# Patient Record
Sex: Female | Born: 1945 | Race: White | Hispanic: No | Marital: Married | Smoking: Never smoker
Health system: Southern US, Community
[De-identification: ages and names within clinical notes are randomized; demographics above are authoritative.]

## PROBLEM LIST (undated history)

## (undated) DIAGNOSIS — E119 Type 2 diabetes mellitus without complications: Secondary | ICD-10-CM

## (undated) DIAGNOSIS — I10 Essential (primary) hypertension: Secondary | ICD-10-CM

---

## 2016-07-17 ENCOUNTER — Observation Stay (HOSPITAL_COMMUNITY)
Admission: EM | Admit: 2016-07-17 | Discharge: 2016-07-18 | Disposition: A | Discharge status: Home / Self Care | Payer: Medicare Other | Attending: Internal Medicine | Admitting: Internal Medicine

## 2016-07-17 ENCOUNTER — Emergency Department (HOSPITAL_COMMUNITY): Payer: Medicare Other

## 2016-07-17 ENCOUNTER — Encounter (HOSPITAL_COMMUNITY): Payer: Self-pay

## 2016-07-17 DIAGNOSIS — M545 Low back pain: Secondary | ICD-10-CM | POA: Diagnosis not present

## 2016-07-17 DIAGNOSIS — G9340 Encephalopathy, unspecified: Secondary | ICD-10-CM | POA: Diagnosis not present

## 2016-07-17 DIAGNOSIS — Z794 Long term (current) use of insulin: Secondary | ICD-10-CM | POA: Diagnosis not present

## 2016-07-17 DIAGNOSIS — F0390 Unspecified dementia without behavioral disturbance: Secondary | ICD-10-CM | POA: Insufficient documentation

## 2016-07-17 DIAGNOSIS — R569 Unspecified convulsions: Secondary | ICD-10-CM | POA: Insufficient documentation

## 2016-07-17 DIAGNOSIS — G8929 Other chronic pain: Secondary | ICD-10-CM | POA: Insufficient documentation

## 2016-07-17 DIAGNOSIS — R110 Nausea: Secondary | ICD-10-CM

## 2016-07-17 DIAGNOSIS — R4182 Altered mental status, unspecified: Secondary | ICD-10-CM

## 2016-07-17 DIAGNOSIS — I10 Essential (primary) hypertension: Secondary | ICD-10-CM | POA: Diagnosis not present

## 2016-07-17 DIAGNOSIS — G454 Transient global amnesia: Secondary | ICD-10-CM | POA: Diagnosis not present

## 2016-07-17 DIAGNOSIS — R413 Other amnesia: Secondary | ICD-10-CM

## 2016-07-17 DIAGNOSIS — Z79899 Other long term (current) drug therapy: Secondary | ICD-10-CM | POA: Diagnosis not present

## 2016-07-17 DIAGNOSIS — E119 Type 2 diabetes mellitus without complications: Secondary | ICD-10-CM

## 2016-07-17 HISTORY — DX: Essential (primary) hypertension: I10

## 2016-07-17 HISTORY — DX: Type 2 diabetes mellitus without complications: E11.9

## 2016-07-17 LAB — URINALYSIS, ROUTINE W REFLEX MICROSCOPIC
Bilirubin Urine: NEGATIVE
Glucose, UA: >=500 mg/dL — AB
HGB URINE DIPSTICK: NEGATIVE
Ketones, ur: NEGATIVE mg/dL
NITRITE: NEGATIVE
Protein, ur: NEGATIVE mg/dL
Specific Gravity, Urine: 1.012 (ref 1.005–1.030)
pH: 6 (ref 5.0–8.0)

## 2016-07-17 LAB — SALICYLATE LEVEL: Salicylate Lvl: <7 mg/dL (ref 2.8–30.0)

## 2016-07-17 LAB — CBC WITH DIFFERENTIAL/PLATELET
BASOS ABS: 0 10*3/uL (ref 0.0–0.1)
BASOS PCT: 1 %
EOS ABS: 0.1 10*3/uL (ref 0.0–0.7)
EOS PCT: 2 %
HCT: 41.6 % (ref 36.0–46.0)
Hemoglobin: 14.9 g/dL (ref 12.0–15.0)
Lymphocytes Relative: 31 %
Lymphs Abs: 1.7 10*3/uL (ref 0.7–4.0)
MCH: 30 pg (ref 26.0–34.0)
MCHC: 35.8 g/dL (ref 30.0–36.0)
MCV: 83.7 fL (ref 78.0–100.0)
Monocytes Absolute: 0.4 10*3/uL (ref 0.1–1.0)
Monocytes Relative: 8 %
Neutro Abs: 3.3 10*3/uL (ref 1.7–7.7)
Neutrophils Relative %: 58 %
PLATELETS: 175 10*3/uL (ref 150–400)
RBC: 4.97 MIL/uL (ref 3.87–5.11)
RDW: 13.3 % (ref 11.5–15.5)
WBC: 5.6 10*3/uL (ref 4.0–10.5)

## 2016-07-17 LAB — HEPATIC FUNCTION PANEL
ALBUMIN: 3.7 g/dL (ref 3.5–5.0)
ALK PHOS: 62 U/L (ref 38–126)
ALT: 14 U/L (ref 14–54)
AST: 18 U/L (ref 15–41)
Bilirubin, Direct: <0.1 mg/dL — ABNORMAL LOW (ref 0.1–0.5)
TOTAL PROTEIN: 7 g/dL (ref 6.5–8.1)
Total Bilirubin: 0.2 mg/dL — ABNORMAL LOW (ref 0.3–1.2)

## 2016-07-17 LAB — BASIC METABOLIC PANEL
ANION GAP: 7 (ref 5–15)
BUN: 8 mg/dL (ref 6–20)
CO2: 26 mmol/L (ref 22–32)
Calcium: 9.6 mg/dL (ref 8.9–10.3)
Chloride: 106 mmol/L (ref 101–111)
Creatinine, Ser: 0.56 mg/dL (ref 0.44–1.00)
Glucose, Bld: 278 mg/dL — ABNORMAL HIGH (ref 65–99)
POTASSIUM: 4 mmol/L (ref 3.5–5.1)
SODIUM: 139 mmol/L (ref 135–145)

## 2016-07-17 LAB — I-STAT TROPONIN, ED: TROPONIN I, POC: 0 ng/mL (ref 0.00–0.08)

## 2016-07-17 LAB — GLUCOSE, CAPILLARY: Glucose-Capillary: 263 mg/dL — ABNORMAL HIGH (ref 65–99)

## 2016-07-17 MED ORDER — PANTOPRAZOLE SODIUM 40 MG PO TBEC
40.0000 mg | DELAYED_RELEASE_TABLET | Freq: Once | ORAL | Status: AC
  Administered 2016-07-17: 40 mg via ORAL
  Filled 2016-07-17: qty 1

## 2016-07-17 MED ORDER — ONDANSETRON HCL 4 MG PO TABS: 4.0000 mg | ORAL_TABLET | Freq: Four times a day (QID) | ORAL | Status: DC | PRN

## 2016-07-17 MED ORDER — SODIUM CHLORIDE 0.9 % IV SOLN
INTRAVENOUS | Status: DC
  Administered 2016-07-17 – 2016-07-18 (×2): via INTRAVENOUS

## 2016-07-17 MED ORDER — METOCLOPRAMIDE HCL 5 MG/ML IJ SOLN
10.0000 mg | Freq: Once | INTRAMUSCULAR | Status: AC
  Administered 2016-07-17: 10 mg via INTRAVENOUS
  Filled 2016-07-17: qty 2

## 2016-07-17 MED ORDER — SODIUM CHLORIDE 0.9% FLUSH
3.0000 mL | Freq: Two times a day (BID) | INTRAVENOUS | Status: DC
  Administered 2016-07-18: 3 mL via INTRAVENOUS

## 2016-07-17 MED ORDER — ONDANSETRON HCL 4 MG/2ML IJ SOLN
4.0000 mg | Freq: Once | INTRAMUSCULAR | Status: AC
  Administered 2016-07-17: 4 mg via INTRAVENOUS
  Filled 2016-07-17: qty 2

## 2016-07-17 MED ORDER — INSULIN ASPART 100 UNIT/ML ~~LOC~~ SOLN
0.0000 [IU] | Freq: Three times a day (TID) | SUBCUTANEOUS | Status: DC
  Administered 2016-07-18 (×2): 3 [IU] via SUBCUTANEOUS

## 2016-07-17 MED ORDER — INSULIN ASPART 100 UNIT/ML ~~LOC~~ SOLN
0.0000 [IU] | Freq: Every day | SUBCUTANEOUS | Status: DC
  Administered 2016-07-17: 3 [IU] via SUBCUTANEOUS

## 2016-07-17 MED ORDER — ACETAMINOPHEN 650 MG RE SUPP: 650.0000 mg | Freq: Four times a day (QID) | RECTAL | Status: DC | PRN

## 2016-07-17 MED ORDER — ONDANSETRON HCL 4 MG/2ML IJ SOLN
4.0000 mg | Freq: Four times a day (QID) | INTRAMUSCULAR | Status: DC | PRN
  Administered 2016-07-18: 4 mg via INTRAVENOUS
  Filled 2016-07-17: qty 2

## 2016-07-17 MED ORDER — SODIUM CHLORIDE 0.9 % IV SOLN
Freq: Once | INTRAVENOUS | Status: AC
  Administered 2016-07-17: 21:00:00 via INTRAVENOUS

## 2016-07-17 MED ORDER — HYDRALAZINE HCL 20 MG/ML IJ SOLN: 20.0000 mg | Freq: Four times a day (QID) | INTRAMUSCULAR | Status: DC | PRN

## 2016-07-17 MED ORDER — ACETAMINOPHEN 325 MG PO TABS
650.0000 mg | ORAL_TABLET | Freq: Four times a day (QID) | ORAL | Status: DC | PRN
  Administered 2016-07-18: 650 mg via ORAL
  Filled 2016-07-17: qty 2

## 2016-07-17 MED ORDER — MORPHINE SULFATE (PF) 4 MG/ML IV SOLN
2.0000 mg | Freq: Once | INTRAVENOUS | Status: AC
  Administered 2016-07-17: 2 mg via INTRAVENOUS
  Filled 2016-07-17: qty 1

## 2016-07-17 NOTE — ED Provider Notes (Signed)
[facility]-EMERGENCY DEPT Provider Note   CSN: [id number] Arrival date & time: 07/17/16  1320     History   Chief Complaint Chief Complaint  Patient presents with  . Altered Mental Status    HPI Denise Green is a 70 y.o. female. She presents with EMS, accompanied by family with memory problems  Family states that they have noticed over the last year some occasional difficulty with memory. This has been rather subtle and occasional.  They've been planning a trip from [place] to [place] to see her nieces and family. They wrapped presents yesterday including last night she was fine.  This morning she spoke with her daughter to discuss the time that they would leave to take these presents as above. In the car between [place] the patient started asking why they were driving and where they were going and what they were doing.  Patient could not recall the name of her family was in the car with her or the family that she was going to see.  On arrival she has no complaints. Initially complains of mild headache here and family states she complained of a mild headache last night.   No history of vascular disease, heart disease, strokes/TIAs. No history of hypertension. Is diabetic and is on Lantus at night.  HPI  Past Medical History:  Diagnosis Date  . Diabetes mellitus without complication (HCC)   . Hypertension     There are no active problems to display for this patient.   History reviewed. No pertinent surgical history.  OB History    No data available       Home Medications    Prior to Admission medications   Not on File    Family History History reviewed. No pertinent family history.  Social History Social History  Substance Use Topics  . Smoking status: Never Smoker  . Smokeless tobacco: Never Used  . Alcohol use No     Allergies   Patient has no known allergies.   Review of Systems Review of Systems  Constitutional:  Negative for appetite change, chills, diaphoresis, fatigue and fever.  HENT: Negative for mouth sores, sore throat and trouble swallowing.   Eyes: Negative for visual disturbance.  Respiratory: Negative for cough, chest tightness, shortness of breath and wheezing.   Cardiovascular: Negative for chest pain.  Gastrointestinal: Negative for abdominal distention, abdominal pain, diarrhea, nausea and vomiting.  Endocrine: Negative for polydipsia, polyphagia and polyuria.  Genitourinary: Negative for dysuria, frequency and hematuria.  Musculoskeletal: Negative for gait problem.  Skin: Negative for color change, pallor and rash.  Neurological: Positive for headaches. Negative for dizziness, syncope and light-headedness.       Memory difficulty  Hematological: Does not bruise/bleed easily.  Psychiatric/Behavioral: Negative for behavioral problems and confusion.     Physical Exam Updated Vital Signs BP 179/79   Pulse 84   Resp 19   SpO2 98%   Physical Exam  Constitutional: She is oriented to person, place, and time. She appears well-developed and well-nourished. No distress.  HENT:  Head: Normocephalic.  Eyes: Conjunctivae are normal. Pupils are equal, round, and reactive to light. No scleral icterus.  Neck: Normal range of motion. Neck supple. No thyromegaly present.  Cardiovascular: Normal rate and regular rhythm.  Exam reveals no gallop and no friction rub.   No murmur heard. Pulmonary/Chest: Effort normal and breath sounds normal. No respiratory distress. She has no wheezes. She has no rales.  Abdominal: Soft. Bowel sounds are normal. She exhibits  no distension. There is no tenderness. There is no rebound.  Musculoskeletal: Normal range of motion.  Neurological: She is alert and oriented to person, place, and time.  No cranial nerve deficits. She has some difficulty recognizing some colors in her left lateral visual field with both eyes but does correct this.  She is unable to recall  the name of her husband and both daughters, (who are present). Cannot recall the name of her grandchildren. She is able to tell me that she is at the hospital, as well as the day, and date.  Skin: Skin is warm and dry. No rash noted.  Psychiatric: She has a normal mood and affect. Her behavior is normal.     ED Treatments / Results  Labs (all labs ordered are listed, but only abnormal results are displayed) Labs Reviewed  URINALYSIS, ROUTINE W REFLEX MICROSCOPIC - Abnormal; Notable for the following:       Result Value   Glucose, UA >=500 (*)    Leukocytes, UA TRACE (*)    Bacteria, UA FEW (*)    Squamous Epithelial / LPF 0-5 (*)    All other components within normal limits  CBC WITH DIFFERENTIAL/PLATELET  BASIC METABOLIC PANEL  I-STAT TROPOININ, ED    EKG  EKG Interpretation None       Radiology Ct Head Wo Contrast  Result Date: 07/17/2016 CLINICAL DATA:  Global amnesia, headache, no injury EXAM: CT HEAD WITHOUT CONTRAST TECHNIQUE: Contiguous axial images were obtained from the base of the skull through the vertex without intravenous contrast. COMPARISON:  None. FINDINGS: Brain: The ventricular system is within normal limits in size for age and there is mild cortical atrophy present. The septum is midline in position. Benign-appearing bilateral basal ganglial calcifications are noted. No hemorrhage, mass lesion, or acute infarction is seen. Vascular: No vascular abnormality is noted on this unenhanced study. Skull: No acute calvarial abnormality is seen. Sinuses/Orbits: There is mucosal thickening within the sphenoid sinus consistent with sphenoid sinus disease. The remainder of the paranasal sinuses are pneumatized with a probable small retention cyst in the floor of the right maxillary sinus. Other: None IMPRESSION: 1. Mild atrophy.  No acute intracranial abnormality. 2. Sphenoid sinus disease. Electronically Signed   By: Dwyane DeePaul  Barry M.D.   On: 07/17/2016 14:53     Procedures Procedures (including critical care time)  Medications Ordered in ED Medications  morphine 4 MG/ML injection 2 mg (2 mg Intravenous Given 07/17/16 1526)  ondansetron (ZOFRAN) injection 4 mg (4 mg Intravenous Given 07/17/16 1526)     Initial Impression / Assessment and Plan / ED Course  I have reviewed the triage vital signs and the nursing notes.  Pertinent labs & imaging results that were available during my care of the patient were reviewed by me and considered in my medical decision making (see chart for details).  Clinical Course     Patient with apparent global amnesia. CT normal. Await MRI. Symptoms onset at 11:30 today. No indication for code stroke. Will discuss with neurology with reevaluation and MRI.  Final Clinical Impressions(s) / ED Diagnoses   Final diagnoses:  Global amnesia    New Prescriptions New Prescriptions   No medications on file     Rolland PorterMark Rafaela Dinius, MD 07/17/16 1528

## 2016-07-17 NOTE — ED Triage Notes (Signed)
Pt. Coming from home via [facility] for sudden onset confusion at 1150. EMS sts patient has no other neuro deficits. Pt. Unable to report last name or president with EMS. Pt. Aox4 at this time. Pt. Hx of DM and HTN. Pt. BP 210/130 manual en route. Pt. Denies any headache.

## 2016-07-17 NOTE — ED Provider Notes (Signed)
Assumed care from Dr. Fayrene FearingJames at 4 PM. Briefly, the patient is a 70 yo F with mild dementia here with likely TGA. No other focal neuro deficits. CT head neg. Neuro consulted, recommends normal w/u and further dispo.  No history of similar episodes. On my assessment at 4:04 PM, pt continues to have global amnesia. She knows who she is/that she is in a hospital but has persistent repetitive questioning, unsure why she is here. MRI pending.   Labs Reviewed  URINALYSIS, ROUTINE W REFLEX MICROSCOPIC - Abnormal; Notable for the following:       Result Value   Glucose, UA >=500 (*)    Leukocytes, UA TRACE (*)    Bacteria, UA FEW (*)    Squamous Epithelial / LPF 0-5 (*)    All other components within normal limits  CBC WITH DIFFERENTIAL/PLATELET  BASIC METABOLIC PANEL  I-STAT TROPOININ, ED    Course of Care: MRI neg. Pt remains awake, alert but with impaired memory. Will d/c home.  Clinical Impression: 1. Global amnesia     Disposition: Discharge  Condition: Good  I have discussed the results, Dx and Tx plan with the pt(& family if present). He/she/they expressed understanding and agree(s) with the plan. Discharge instructions discussed at great length. Strict return precautions discussed and pt &/or family have verbalized understanding of the instructions. No further questions at time of discharge.      Shaune Pollackameron Adorian Gwynne, MD 07/18/16 [phone]

## 2016-07-17 NOTE — ED Notes (Signed)
Notified Dr. Fayrene FearingJames that pt is c/o headache at this time.

## 2016-07-17 NOTE — H&P (Signed)
History and Physical    Adelei Green ZOX:[id number] DOB: 12/06/1945 DOA: 07/17/2016  PCP: Patient is from [place]  Patient coming from: HOME  Chief Complaint: Altered mental status  HPI: Denise Green is a 70 y.o. woman with a history of mild dementia at baseline, HTN, and diabetes who is accompanied by multiple family members tonight.  She is unable to give her own history due to acute mental status changes.  Per her husband and daughter, while en route to [facility] to visit family today, the patient became acutely confused.  She as unable to identify family and had forgotten where they were going and why.  She was reportedly in her baseline state of health until this acute change.  She has complained of headache, dizziness, and nausea.  No chest pain.  No LOC.  No seizure-like activity.  No bowel or bladder incontinence.  No fever.  She has urinary frequency and low back pain at baseline.  She fell last week (accidental) while taking down a Christmas tree.  She takes a significant amount of Aleve and aspirin for her chronic pain.  Husband reports that she took FOUR full strength aspirin this AM, prior to leaving home, for her back.    ED Course: Labs fairly unremarkable, including LFTs and salicylate level.  Head CT and MRI brain negative for acute CVA.  U/A does not appear to be infected.  BP was elevated upon presentation (systolic 200) but this has improved without specific intervention.  Hospitalist asked to place in observation.  Review of Systems: Unable to obtain due to mental status changes.   Past Medical History:  Diagnosis Date  . Diabetes mellitus without complication (HCC)   . Hypertension     Past Surgical History: Cesarean x 3 Hysterectomy   reports that she has never smoked. She has never used smokeless tobacco. She reports that she does not drink alcohol or use drugs. She is married.  She has three children.  Allergies  Allergen Reactions  . Codeine  Other (See Comments)    intolerance    FAMILY HISTORY: Father had DM Mother had CHF  Prior to Admission medications   Medication Sig Start Date End Date Taking? Authorizing Provider  aspirin 325 MG tablet Take 325 mg by mouth every 6 (six) hours as needed.   Yes Historical Provider, MD  insulin glargine (LANTUS) 100 UNIT/ML injection Inject 0-10 Units into the skin See admin instructions. Per sliding scale   Yes Historical Provider, MD  naproxen sodium (ANAPROX) 220 MG tablet Take 220 mg by mouth 2 (two) times daily as needed (pain).   Yes Historical Provider, MD    Physical Exam: Vitals:   07/17/16 1811 07/17/16 1900 07/17/16 1930 07/17/16 2030  BP:  (!) 131/43 (!) 135/52 137/73  Pulse: 97 93 91 97  Resp: 18 23 16 12   SpO2: 98% 97% 96% 97%      Constitutional: NAD, calm but disoriented.  Uncooperative with exam but not combative. Vitals:   07/17/16 1811 07/17/16 1900 07/17/16 1930 07/17/16 2030  BP:  (!) 131/43 (!) 135/52 137/73  Pulse: 97 93 91 97  Resp: 18 23 16 12   SpO2: 98% 97% 96% 97%   Eyes: PERRL, lids and conjunctivae normal ENMT: Mucous membranes are moist.  Neck: normal appearance, supple Respiratory: clear to auscultation bilaterally, no wheezing, no crackles. Normal respiratory effort. No accessory muscle use.  Cardiovascular: Normal rate, regular rhythm, no murmurs / rubs / gallops. No extremity edema. 2+ pedal pulses. No  carotid bruits.  GI: abdomen is soft and compressible.  No distention.  No tenderness.  Bowel sounds are present. Musculoskeletal:  No joint deformity in upper and lower extremities. Good ROM, no contractures. Normal muscle tone.  Skin: no rashes, warm and dry Neurologic: CN 2-12 grossly intact. Sensation intact, Strength symmetric bilaterally, 5/5  Psychiatric: Only oriented to self.  Judgement and insight impaired due to mental status changes.   Labs on Admission: I have personally reviewed following labs and imaging  studies  CBC:  Recent Labs Lab 07/17/16 1505  WBC 5.6  NEUTROABS 3.3  HGB 14.9  HCT 41.6  MCV 83.7  PLT 175   Basic Metabolic Panel:  Recent Labs Lab 07/17/16 1505  NA 139  K 4.0  CL 106  CO2 26  GLUCOSE 278*  BUN 8  CREATININE 0.56  CALCIUM 9.6   GFR: CrCl cannot be calculated (Unknown ideal weight.). Liver Function Tests:  Recent Labs Lab 07/17/16 2021  AST 18  ALT 14  ALKPHOS 62  BILITOT 0.2*  PROT 7.0  ALBUMIN 3.7   Urine analysis:    Component Value Date/Time   COLORURINE YELLOW 07/17/2016 1441   APPEARANCEUR CLEAR 07/17/2016 1441   LABSPEC 1.012 07/17/2016 1441   PHURINE 6.0 07/17/2016 1441   GLUCOSEU >=500 (A) 07/17/2016 1441   HGBUR NEGATIVE 07/17/2016 1441   BILIRUBINUR NEGATIVE 07/17/2016 1441   KETONESUR NEGATIVE 07/17/2016 1441   PROTEINUR NEGATIVE 07/17/2016 1441   NITRITE NEGATIVE 07/17/2016 1441   LEUKOCYTESUR TRACE (A) 07/17/2016 1441    Radiological Exams on Admission: Ct Head Wo Contrast  Result Date: 07/17/2016 CLINICAL DATA:  Global amnesia, headache, no injury EXAM: CT HEAD WITHOUT CONTRAST TECHNIQUE: Contiguous axial images were obtained from the base of the skull through the vertex without intravenous contrast. COMPARISON:  None. FINDINGS: Brain: The ventricular system is within normal limits in size for age and there is mild cortical atrophy present. The septum is midline in position. Benign-appearing bilateral basal ganglial calcifications are noted. No hemorrhage, mass lesion, or acute infarction is seen. Vascular: No vascular abnormality is noted on this unenhanced study. Skull: No acute calvarial abnormality is seen. Sinuses/Orbits: There is mucosal thickening within the sphenoid sinus consistent with sphenoid sinus disease. The remainder of the paranasal sinuses are pneumatized with a probable small retention cyst in the floor of the right maxillary sinus. Other: None IMPRESSION: 1. Mild atrophy.  No acute intracranial  abnormality. 2. Sphenoid sinus disease. Electronically Signed   By: Dwyane DeePaul  Barry M.D.   On: 07/17/2016 14:53   Mr Brain Wo Contrast  Result Date: 07/17/2016 CLINICAL DATA:  Altered mental status, global amnesia today. Hyperglycemia. History of hypertension and diabetes. EXAM: MRI HEAD WITHOUT CONTRAST TECHNIQUE: Multiplanar, multiecho pulse sequences of the brain and surrounding structures were obtained without intravenous contrast. COMPARISON:  CT HEAD July 17, 2016 at 1444 hours FINDINGS: Moderately motion degraded examination. BRAIN: No reduced diffusion to suggest acute ischemia. No susceptibility artifact to suggest hemorrhage. The ventricles and sulci are normal for patient's age. No suspicious parenchymal signal, masses or mass effect. No abnormal extra-axial fluid collections. No extra-axial masses though, contrast enhanced sequences would be more sensitive. VASCULAR: Normal major intracranial vascular flow voids present at skull base. SKULL AND UPPER CERVICAL SPINE: No abnormal sellar expansion. No suspicious calvarial bone marrow signal. Craniocervical junction maintained. SINUSES/ORBITS: The mastoid air-cells and included paranasal sinuses are well-aerated. The included ocular globes and orbital contents are non-suspicious. OTHER: None. IMPRESSION: Negative moderately motion degraded MRI  head. Electronically Signed   By: Awilda Metroourtnay  Bloomer M.D.   On: 07/17/2016 18:20    EKG: Independently reviewed. Low voltage but appears to be sinus rhythm.  Assessment/Plan Principal Problem:   Acute encephalopathy Active Problems:   Transient global amnesia   Nausea   Dementia   HTN (hypertension)   Diabetes (HCC)      Acute encephalopathy with global amnesia, etiology unclear.  She does not appear to have salicylate toxicity a this time. --Neurology consult pending.  With negative head imaging, may need to consider LP if she is not better by morning. --EEG pending --Urine drug screen  pending --Serological eval including RPR, TSH, B12 --Hydrate with NS --Repeat BMP in the AM --Consider ABG though she is not hypoxic and has been protecting her airway well --Chest xray added, will check KUB as well to rule out constipation --Fall precautions, aspiration precautions  Nausea --PPI --Zofran prn --Monitor for bloody emesis --Clear liquids only for now  DM --SSI coverage for now  Accelerated HTN --IV hydralazine prn   DVT prophylaxis: SCDs Code Status: FULL Family Communication: Husband, daughter, other family present in the ED at time of admission. Disposition Plan: To be determined. Consults called: Neurology Admission status: Place in observation with telemetry monitoring   TIME SPENT: 70 minutes   Jerene Bearsarter,Draiden Mirsky Harrison MD [facility] Pager [phone]  If 7PM-7AM, please contact night-coverage [email or web address] Password [email or web address]  07/17/2016, 8:42 PM

## 2016-07-18 ENCOUNTER — Observation Stay (HOSPITAL_COMMUNITY): Payer: Medicare Other

## 2016-07-18 DIAGNOSIS — I10 Essential (primary) hypertension: Secondary | ICD-10-CM

## 2016-07-18 DIAGNOSIS — R569 Unspecified convulsions: Secondary | ICD-10-CM | POA: Diagnosis not present

## 2016-07-18 DIAGNOSIS — G9340 Encephalopathy, unspecified: Secondary | ICD-10-CM

## 2016-07-18 DIAGNOSIS — F0390 Unspecified dementia without behavioral disturbance: Secondary | ICD-10-CM | POA: Diagnosis not present

## 2016-07-18 LAB — BASIC METABOLIC PANEL
ANION GAP: 5 (ref 5–15)
BUN: 8 mg/dL (ref 6–20)
CALCIUM: 9.1 mg/dL (ref 8.9–10.3)
CO2: 25 mmol/L (ref 22–32)
Chloride: 107 mmol/L (ref 101–111)
Creatinine, Ser: 0.52 mg/dL (ref 0.44–1.00)
GLUCOSE: 168 mg/dL — AB (ref 65–99)
Potassium: 4.9 mmol/L (ref 3.5–5.1)
SODIUM: 137 mmol/L (ref 135–145)

## 2016-07-18 LAB — RAPID URINE DRUG SCREEN, HOSP PERFORMED
AMPHETAMINES: NOT DETECTED
Barbiturates: NOT DETECTED
Benzodiazepines: NOT DETECTED
Cocaine: NOT DETECTED
Opiates: NOT DETECTED
Tetrahydrocannabinol: NOT DETECTED

## 2016-07-18 LAB — GLUCOSE, CAPILLARY
GLUCOSE-CAPILLARY: 152 mg/dL — AB (ref 65–99)
GLUCOSE-CAPILLARY: 200 mg/dL — AB (ref 65–99)

## 2016-07-18 LAB — VITAMIN B12: VITAMIN B 12: 839 pg/mL (ref 180–914)

## 2016-07-18 LAB — RPR: RPR: NONREACTIVE

## 2016-07-18 LAB — MAGNESIUM: MAGNESIUM: 2 mg/dL (ref 1.7–2.4)

## 2016-07-18 LAB — TSH: TSH: 0.811 u[IU]/mL (ref 0.350–4.500)

## 2016-07-18 MED ORDER — LEVETIRACETAM 500 MG PO TABS: 500.0000 mg | ORAL_TABLET | Freq: Two times a day (BID) | ORAL | 0 refills | Status: DC

## 2016-07-18 MED ORDER — LEVETIRACETAM 500 MG PO TABS: 500.0000 mg | ORAL_TABLET | Freq: Two times a day (BID) | ORAL | Status: DC

## 2016-07-18 MED ORDER — LEVETIRACETAM 500 MG PO TABS: 500.0000 mg | ORAL_TABLET | Freq: Two times a day (BID) | ORAL | 0 refills | Status: AC

## 2016-07-18 MED ORDER — LEVETIRACETAM 500 MG PO TABS
1000.0000 mg | ORAL_TABLET | Freq: Once | ORAL | Status: AC
  Administered 2016-07-18: 1000 mg via ORAL
  Filled 2016-07-18: qty 2

## 2016-07-18 NOTE — Procedures (Signed)
ELECTROENCEPHALOGRAM REPORT  Date of Study: 07/18/2016  Patient's Name: Denise Green MRN: [id number] Date of Birth: 04-17-1946  Referring Provider: Michael LitterNikki Carter, MD  Clinical History: 70 year old female with dementia who presents with altered mental status.  Medications: acetaminophen (TYLENOL) tablet 650 mg  hydrALAZINE (APRESOLINE) injection 20 mg  insulin aspart (novoLOG) injection 0-15 Units  ondansetron (ZOFRAN) tablet 4 mg   Technical Summary: A multichannel digital EEG recording measured by the international 10-20 system with electrodes applied with paste and impedances below 5000 ohms performed in our laboratory with EKG monitoring in an awake and drowsy patient.  Hyperventilation was not performed.  Photic stimulation was performed.  The digital EEG was referentially recorded, reformatted, and digitally filtered in a variety of bipolar and referential montages for optimal display.    Description: The patient is awake and drowsy during the recording.  During maximal wakefulness, there is a symmetric, medium voltage 10 Hz posterior dominant rhythm that attenuates with eye opening.  The record is symmetric.  During drowsiness and sleep, there is an increase in theta slowing of the background.  Photic stimulation did not elicit any abnormalities.  There is left temporal delta slowing.  There were no epileptiform discharges or electrographic seizures seen.    EKG lead was unremarkable.  Impression: This awake and drowsy EEG is abnormal due to left temporal slowing.  Clinical Correlation: The above findings may be due to a structural or physiologic abnormality in the left temporal region. Clinical correlation is advised.  Shon MilletAdam Burr Soffer, DO

## 2016-07-18 NOTE — Care Management Obs Status (Signed)
MEDICARE OBSERVATION STATUS NOTIFICATION   Patient Details  Name: Denise Green MRN: [id number] Date of Birth: October 02, 1945   Medicare Observation Status Notification Given:  Yes    Kermit BaloKelli F Israel Wunder, RN 07/18/2016, 2:37 PM

## 2016-07-18 NOTE — Discharge Summary (Addendum)
Physician Discharge Summary  Denise Green GNF:[id number] DOB: [birth year] DOA: 07/17/2016  PCP: No PCP Per Patient  Admit date: 07/17/2016 Discharge date: 07/18/2016   Recommendations for Outpatient Follow-Up:   Ambulatory referral to neurology No driving 6 months  Discharge Diagnosis:   Principal Problem:   Acute encephalopathy Active Problems:   Transient global amnesia   Nausea   Dementia   HTN (hypertension)   Diabetes (HCC)   Seizures (HCC)   Discharge disposition:  Home.   Discharge Condition: Improved.  Diet recommendation: Low sodium, heart healthy.  Carbohydrate-modified  Wound care: None.   History of Present Illness:   Denise Green is a 70 y.o. woman with a history of mild dementia at baseline, HTN, and diabetes who is accompanied by multiple family members tonight.  She is unable to give her own history due to acute mental status changes.  Per her husband and daughter, while en route to [facility] to visit family today, the patient became acutely confused.  She as unable to identify family and had forgotten where they were going and why.  She was reportedly in her baseline state of health until this acute change.  She has complained of headache, dizziness, and nausea.  No chest pain.  No LOC.  No seizure-like activity.  No bowel or bladder incontinence.  No fever.  She has urinary frequency and low back pain at baseline.  She fell last week (accidental) while taking down a Christmas tree.  She takes a significant amount of Aleve and aspirin for her chronic pain.  Husband reports that she took FOUR full strength aspirin this AM, prior to leaving home, for her back.     Hospital Course by Problem:   AMS due to seizures -suspected focal seizures with temporal lobe changes on EEG-- spoke with on call neurology and plan to load with keppra and then PO keppra 500 mg PO BID -back to baseline per family -MRI/CT scan no major abnormalities  Elevated  BP -resolved to normal  Dementia -back to baseline per family    Medical Consultants:    Neuro (phone)   Discharge Exam:   Vitals:   07/18/16 0602 07/18/16 1056  BP: (!) 119/51 (!) 128/59  Pulse: 71 82  Resp: 18 19  Temp: 97.9 F (36.6 C) 99.1 F (37.3 C)   Vitals:   07/17/16 2110 07/18/16 0236 07/18/16 0602 07/18/16 1056  BP: (!) 154/60 (!) 121/56 (!) 119/51 (!) 128/59  Pulse: 91 83 71 82  Resp: 16 16 18 19   Temp: 99.7 F (37.6 C) 98.5 F (36.9 C) 97.9 F (36.6 C) 99.1 F (37.3 C)  TempSrc: Oral Oral Oral Oral  SpO2: 97% 97% 96% 98%  Weight: 95.7 kg (210 lb 14.4 oz)       Gen:  NAD    The results of significant diagnostics from this hospitalization (including imaging, microbiology, ancillary and laboratory) are listed below for reference.     Procedures and Diagnostic Studies:   Dg Chest 1 View  Result Date: 07/18/2016 CLINICAL DATA:  Headache, nausea, and fusion. Altered mental status. EXAM: CHEST 1 VIEW COMPARISON:  None. FINDINGS: The heart size and mediastinal contours are within normal limits. Both lungs are clear. The lung volumes are low. The visualized skeletal structures are unremarkable. IMPRESSION: Negative one-view chest x-ray Electronically Signed   By: Marin Roberts M.D.   On: 07/18/2016 10:10   Dg Abd 1 View  Result Date: 07/18/2016 CLINICAL DATA:  Headache and nausea.  Altered  mental status. EXAM: ABDOMEN - 1 VIEW COMPARISON:  None. FINDINGS: The bowel gas pattern is normal. Vascular calcifications are noted. Degenerative changes are present in the lumbar spine. No radio-opaque calculi or other significant radiographic abnormality are seen. IMPRESSION: Negative one view abdomen. Electronically Signed   By: Marin Robertshristopher  Mattern M.D.   On: 07/18/2016 10:11   Ct Head Wo Contrast  Result Date: 07/17/2016 CLINICAL DATA:  Global amnesia, headache, no injury EXAM: CT HEAD WITHOUT CONTRAST TECHNIQUE: Contiguous axial images were obtained  from the base of the skull through the vertex without intravenous contrast. COMPARISON:  None. FINDINGS: Brain: The ventricular system is within normal limits in size for age and there is mild cortical atrophy present. The septum is midline in position. Benign-appearing bilateral basal ganglial calcifications are noted. No hemorrhage, mass lesion, or acute infarction is seen. Vascular: No vascular abnormality is noted on this unenhanced study. Skull: No acute calvarial abnormality is seen. Sinuses/Orbits: There is mucosal thickening within the sphenoid sinus consistent with sphenoid sinus disease. The remainder of the paranasal sinuses are pneumatized with a probable small retention cyst in the floor of the right maxillary sinus. Other: None IMPRESSION: 1. Mild atrophy.  No acute intracranial abnormality. 2. Sphenoid sinus disease. Electronically Signed   By: Dwyane DeePaul  Barry M.D.   On: 07/17/2016 14:53   Mr Brain Wo Contrast  Result Date: 07/17/2016 CLINICAL DATA:  Altered mental status, global amnesia today. Hyperglycemia. History of hypertension and diabetes. EXAM: MRI HEAD WITHOUT CONTRAST TECHNIQUE: Multiplanar, multiecho pulse sequences of the brain and surrounding structures were obtained without intravenous contrast. COMPARISON:  CT HEAD July 17, 2016 at 1444 hours FINDINGS: Moderately motion degraded examination. BRAIN: No reduced diffusion to suggest acute ischemia. No susceptibility artifact to suggest hemorrhage. The ventricles and sulci are normal for patient's age. No suspicious parenchymal signal, masses or mass effect. No abnormal extra-axial fluid collections. No extra-axial masses though, contrast enhanced sequences would be more sensitive. VASCULAR: Normal major intracranial vascular flow voids present at skull base. SKULL AND UPPER CERVICAL SPINE: No abnormal sellar expansion. No suspicious calvarial bone marrow signal. Craniocervical junction maintained. SINUSES/ORBITS: The mastoid air-cells  and included paranasal sinuses are well-aerated. The included ocular globes and orbital contents are non-suspicious. OTHER: None. IMPRESSION: Negative moderately motion degraded MRI head. Electronically Signed   By: Awilda Metroourtnay  Bloomer M.D.   On: 07/17/2016 18:20     Labs:   Basic Metabolic Panel:  Recent Labs Lab 07/17/16 1505 07/18/16 0538  NA 139 137  K 4.0 4.9  CL 106 107  CO2 26 25  GLUCOSE 278* 168*  BUN 8 8  CREATININE 0.56 0.52  CALCIUM 9.6 9.1   GFR CrCl cannot be calculated (Unknown ideal weight.). Liver Function Tests:  Recent Labs Lab 07/17/16 2021  AST 18  ALT 14  ALKPHOS 62  BILITOT 0.2*  PROT 7.0  ALBUMIN 3.7   No results for input(s): LIPASE, AMYLASE in the last 168 hours. No results for input(s): AMMONIA in the last 168 hours. Coagulation profile No results for input(s): INR, PROTIME in the last 168 hours.  CBC:  Recent Labs Lab 07/17/16 1505  WBC 5.6  NEUTROABS 3.3  HGB 14.9  HCT 41.6  MCV 83.7  PLT 175   Cardiac Enzymes: No results for input(s): CKTOTAL, CKMB, CKMBINDEX, TROPONINI in the last 168 hours. BNP: Invalid input(s): POCBNP CBG:  Recent Labs Lab 07/17/16 2200 07/18/16 0648  GLUCAP 263* 152*   D-Dimer No results for input(s): DDIMER in the  last 72 hours. Hgb A1c No results for input(s): HGBA1C in the last 72 hours. Lipid Profile No results for input(s): CHOL, HDL, LDLCALC, TRIG, CHOLHDL, LDLDIRECT in the last 72 hours. Thyroid function studies  Recent Labs  07/17/16 2327  TSH 0.811   Anemia work up  Recent Labs  07/17/16 2327  VITAMINB12 839   Microbiology No results found for this or any previous visit (from the past 240 hour(s)).   Discharge Instructions:   Discharge Instructions    Ambulatory referral to Neurology    Complete by:  As directed    An appointment is requested in approximately: 4 weeks F/up seizures   Diet - low sodium heart healthy    Complete by:  As directed    Diet Carb  Modified    Complete by:  As directed    Discharge instructions    Complete by:  As directed    No driving 6 months   Increase activity slowly    Complete by:  As directed      Allergies as of 07/18/2016      Reactions   Codeine Other (See Comments)   intolerance      Medication List    TAKE these medications   aspirin 325 MG tablet Take 325 mg by mouth every 6 (six) hours as needed.   insulin glargine 100 UNIT/ML injection Commonly known as:  LANTUS Inject 0-10 Units into the skin See admin instructions. Per sliding scale   levETIRAcetam 500 MG tablet Commonly known as:  KEPPRA Take 1 tablet (500 mg total) by mouth 2 (two) times daily.   naproxen sodium 220 MG tablet Commonly known as:  ANAPROX Take 220 mg by mouth 2 (two) times daily as needed (pain).      Follow-up Information    PCP Follow up in 1 week(s).            Time coordinating discharge: 35 min  Signed:  Clova Morlock U [place] Gandolfo   [facility] 07/18/2016, 1:31 PM

## 2016-07-18 NOTE — Care Management Note (Signed)
Case Management Note  Patient Details  Name: Denise JubileeStella Fritsche MRN: [id number] Date of Birth: [birth year]  Subjective/Objective:                    Action/Plan: Pt discharging home with self care and her husband. No further needs per CM.   Expected Discharge Date:                  Expected Discharge Plan:  Home/Self Care  In-House Referral:     Discharge planning Services     Post Acute Care Choice:    Choice offered to:     DME Arranged:    DME Agency:     HH Arranged:    HH Agency:     Status of Service:  Completed, signed off  If discussed at [employer] Length [employer] Stay Meetings, dates discussed:    Additional Comments:  Kermit BaloKelli F Rulon Abdalla, RN 07/18/2016, 3:11 PM

## 2016-07-18 NOTE — Progress Notes (Signed)
EEG completed, results pending. 

## 2016-07-18 NOTE — Care Management Note (Signed)
Case Management Note  Patient Details  Name: Denise JubileeStella Green MRN: [id number] Date of Birth: 01-18-1946  Subjective/Objective:   Pt in with acute encephalopathy. She is from home with her husband.                  Action/Plan: CM following for d/c needs when patient is medically ready.  Expected Discharge Date:                  Expected Discharge Plan:  Home/Self Care  In-House Referral:     Discharge planning Services     Post Acute Care Choice:    Choice offered to:     DME Arranged:    DME Agency:     HH Arranged:    HH Agency:     Status of Service:  In process, will continue to follow  If discussed at Long Length of Stay Meetings, dates discussed:    Additional Comments:  Kermit BaloKelli F Maryalice Pasley, RN 07/18/2016, 11:22 AM

## 2016-07-18 NOTE — Progress Notes (Signed)
Discharge instructions reviewed with the patient and family.  Keppra dosing  Reviewed as well as follow up appointments and activity.  Written copies and prescriptions given to the patient. To door via wheelchair.  Home via POV with her son driving.

## 2018-02-17 IMAGING — CT CT HEAD W/O CM
4 series · 18 of 47 positions shown, 20 images · non-contrast
Comparison: None.

CLINICAL DATA: Global amnesia, headache, no injury

EXAM:
CT HEAD WITHOUT CONTRAST
TECHNIQUE: Contiguous axial images were obtained from the base of the skull
through the vertex without intravenous contrast.

[Series 201: head w/o, idose (1) · axial · non-contrast · 0.41mm/px · z∈[+233,+353]mm · 8 of 32 slices shown, 10 images]
[im 4/32  brain]
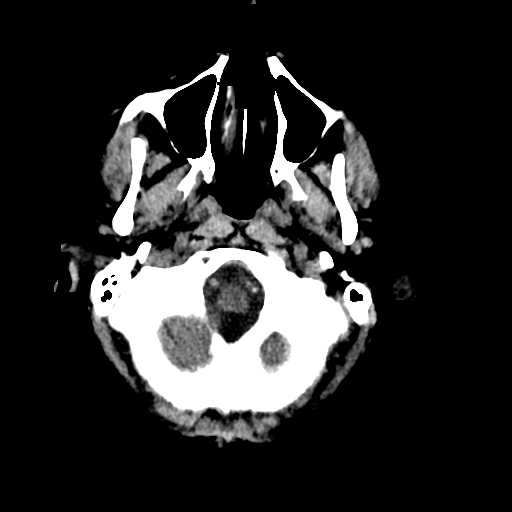
[im 4/32  bone]
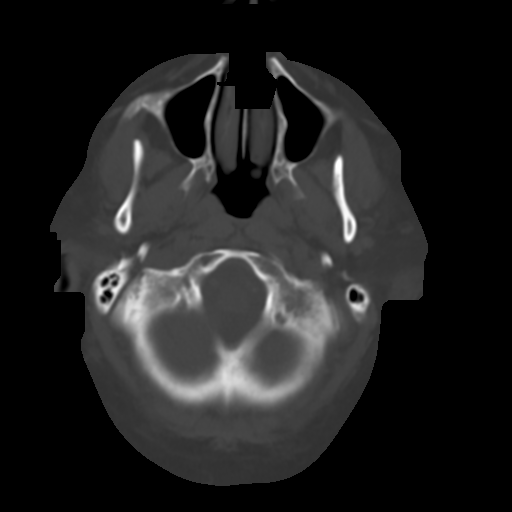
[im 7/32  brain]
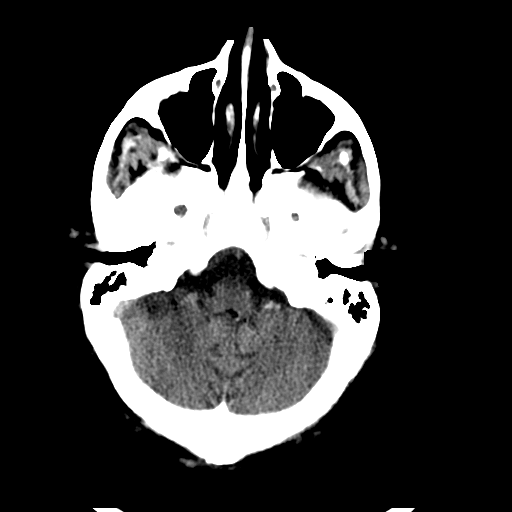
[im 11/32  brain]
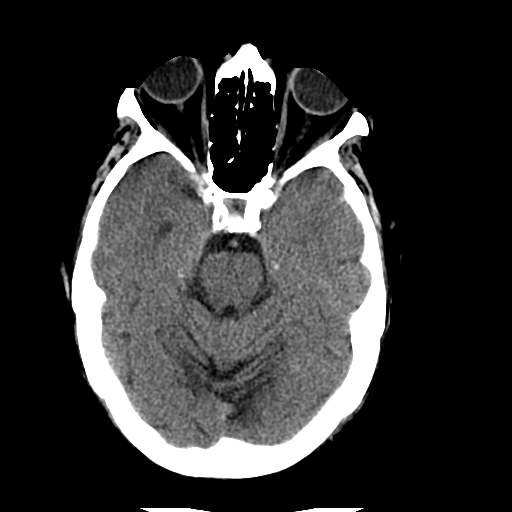
[im 14/32  brain]
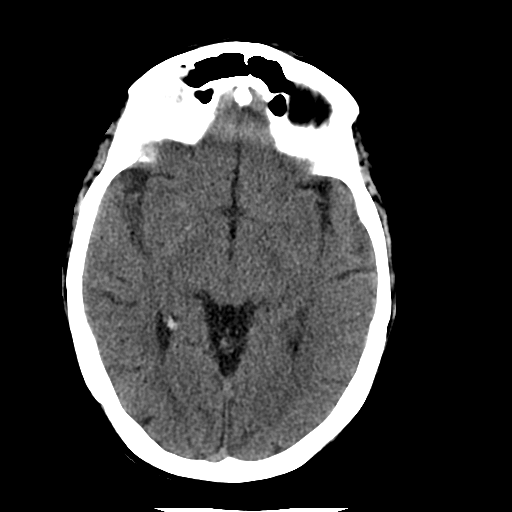
[im 18/32  brain]
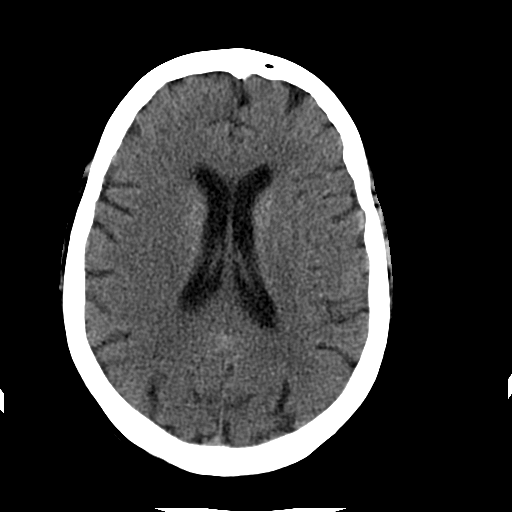
[im 18/32  bone]
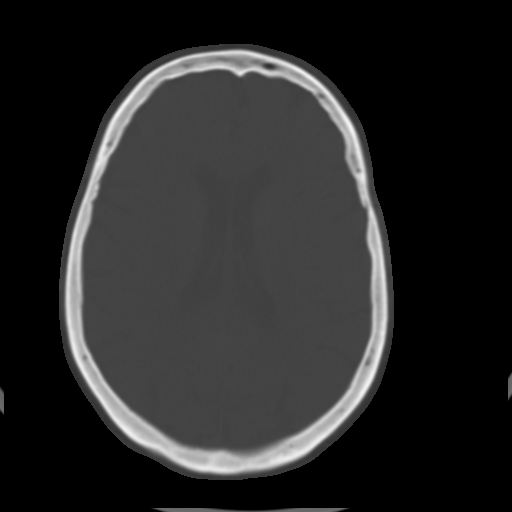
[im 21/32  brain]
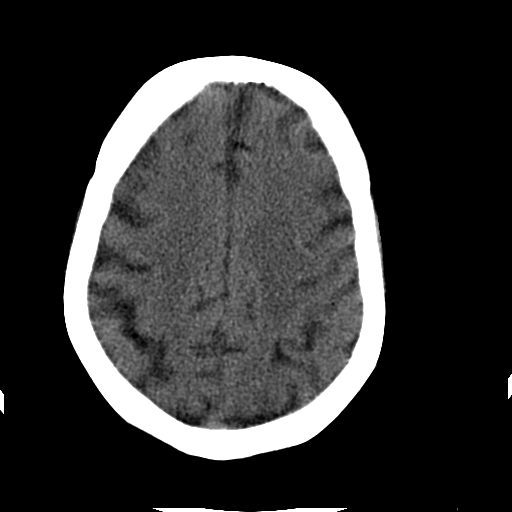
[im 25/32  brain]
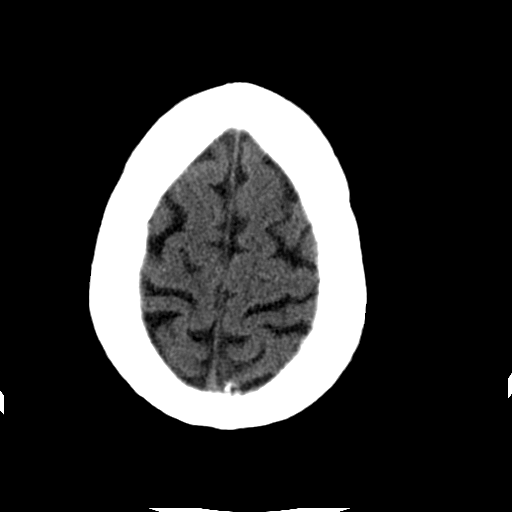
[im 28/32  brain]
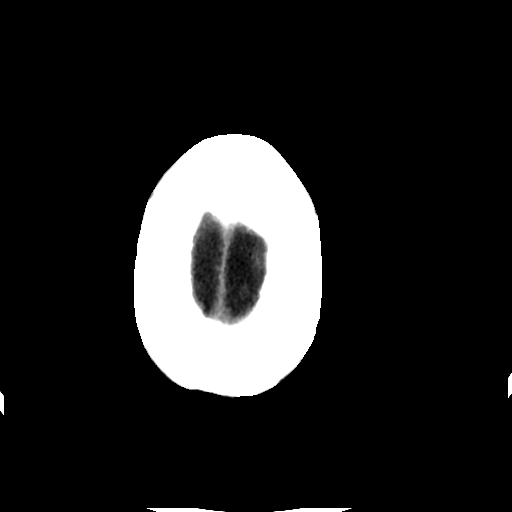

[Series 202: head w/o bone, idose (1) · axial · non-contrast · 0.41mm/px · z∈[+231,+281]mm · 4 of 64 slices shown]
[im 7/64  bone]
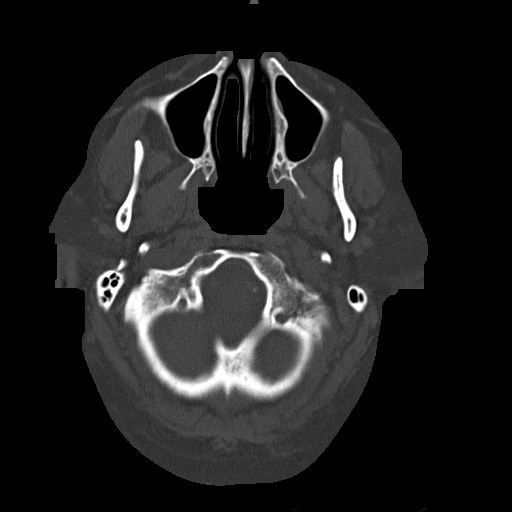
[im 14/64  bone]
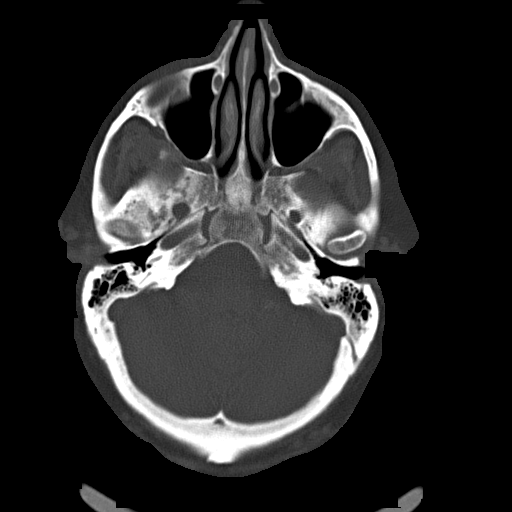
[im 20/64  bone]
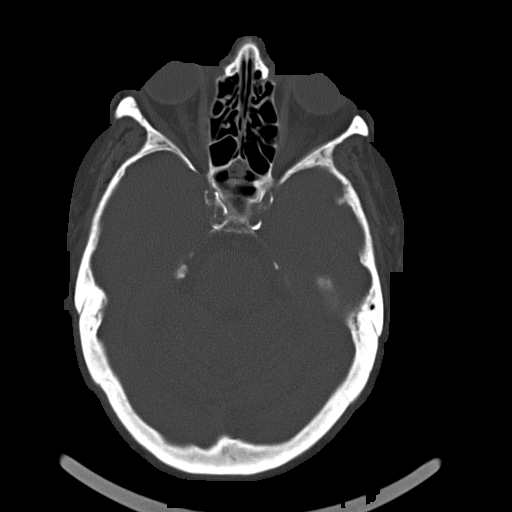
[im 27/64  bone]
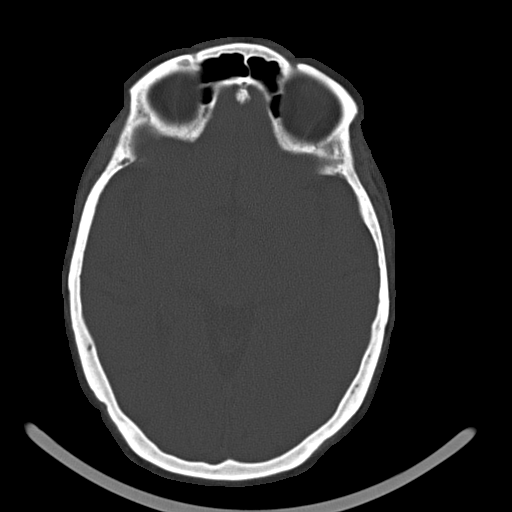

[Series 203: coronal st, idose (1) · coronal · 0.40mm/px · 3 of 70 slices shown]
[im 24/70  brain]
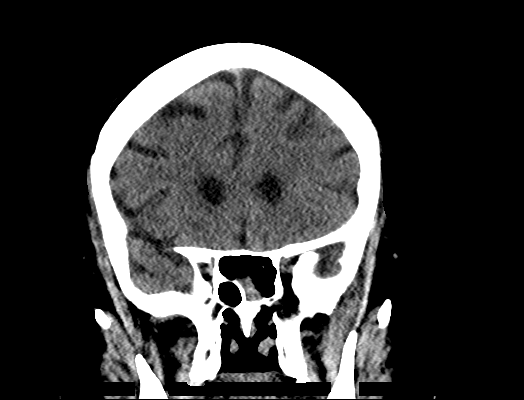
[im 31/70  brain]
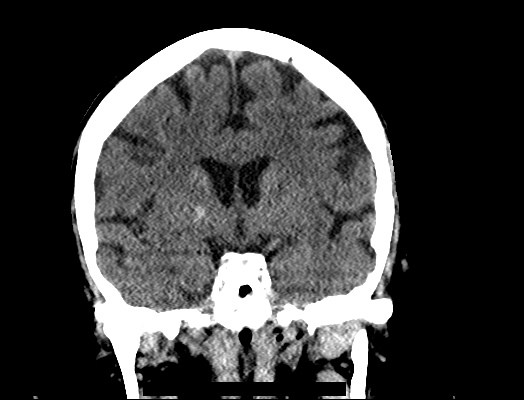
[im 39/70  brain]
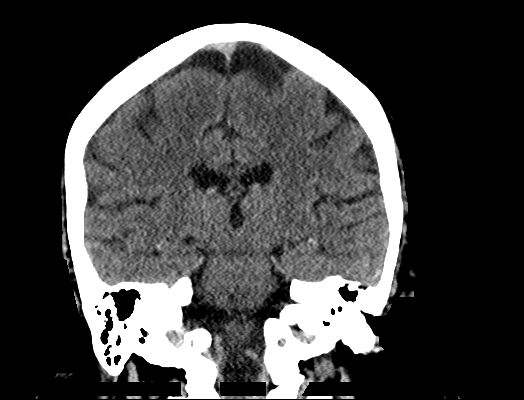

[Series 204: sagittal st, idose (1) · sagittal · 0.40mm/px · 3 of 70 slices shown]
[im 24/70  brain]
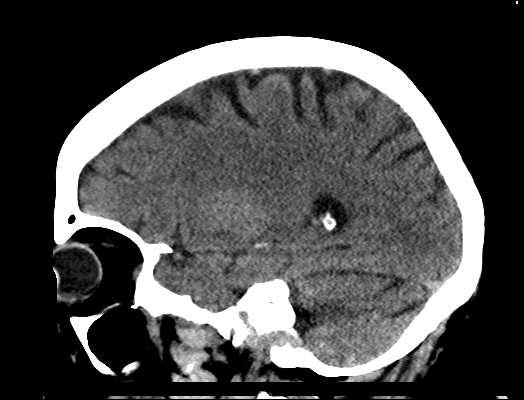
[im 35/70  brain]
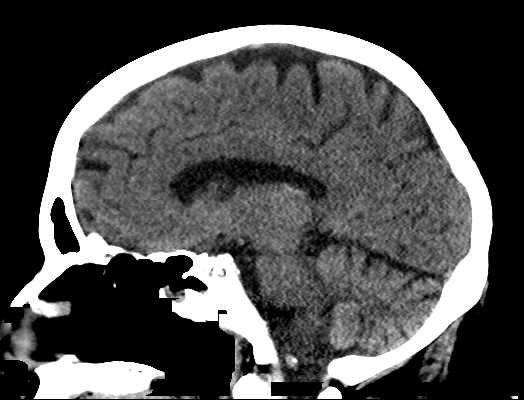
[im 47/70  brain]
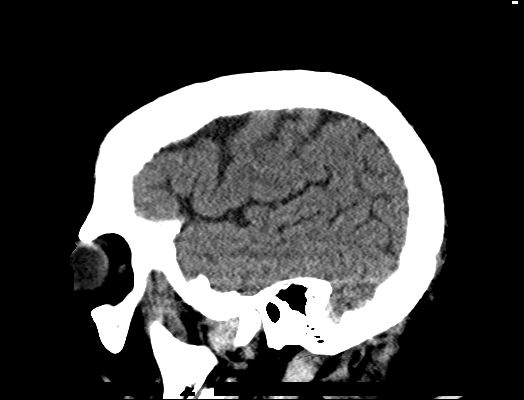

[18 of 47 positions shown; findings below may reference images not displayed]

FINDINGS: Brain: The ventricular system is within normal limits in size for
age and there is mild cortical atrophy present. The septum is
midline in position. Benign-appearing bilateral basal ganglial
calcifications are noted. No hemorrhage, mass lesion, or acute
infarction is seen.

Vascular: No vascular abnormality is noted on this unenhanced study.

Skull: No acute calvarial abnormality is seen.

Sinuses/Orbits: There is mucosal thickening within the sphenoid
sinus consistent with sphenoid sinus disease. The remainder of the
paranasal sinuses are pneumatized with a probable small retention
cyst in the floor of the right maxillary sinus.

Other: None
IMPRESSION: 1. Mild atrophy.  No acute intracranial abnormality.
2. Sphenoid sinus disease.

## 2018-02-18 IMAGING — DX DG ABDOMEN 1V
2 series · 2 of 2 positions shown · non-contrast
Comparison: None.

CLINICAL DATA: Headache and nausea.  Altered mental status.

EXAM:
ABDOMEN - 1 VIEW

[abdomen kub (1 of 2)]
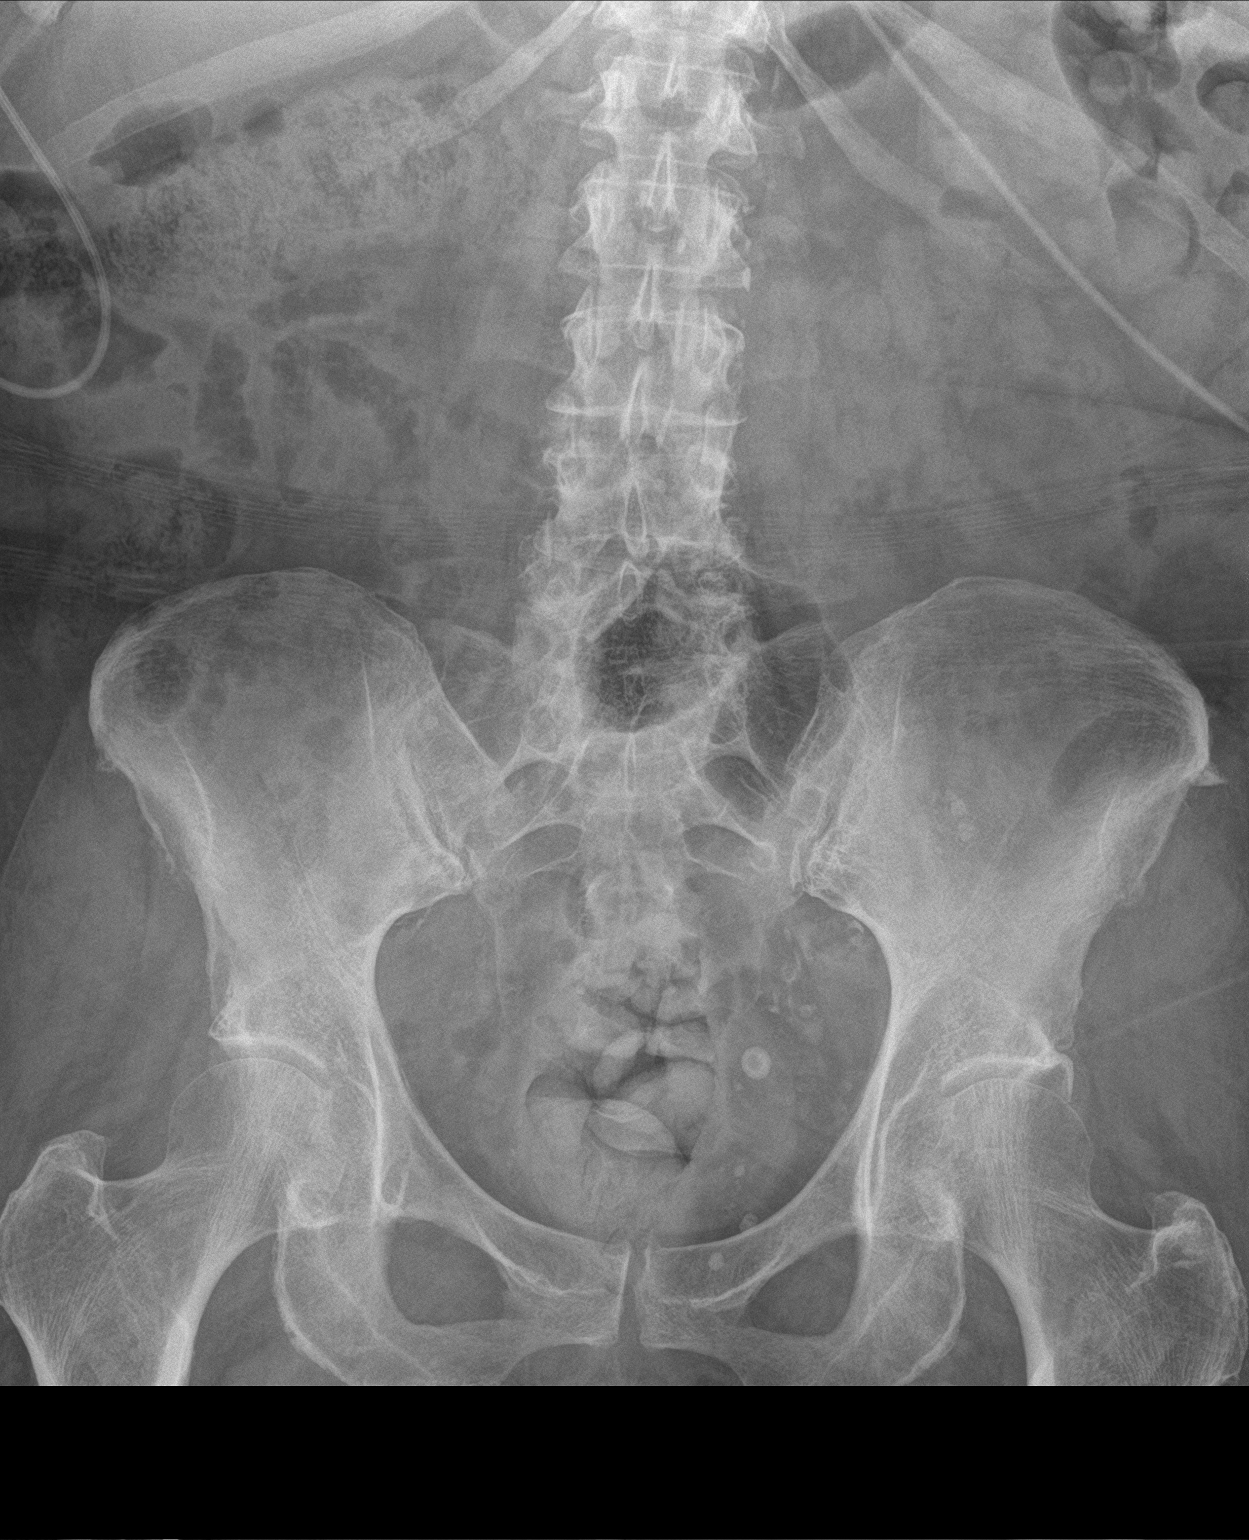

[abdomen kub (2 of 2)]
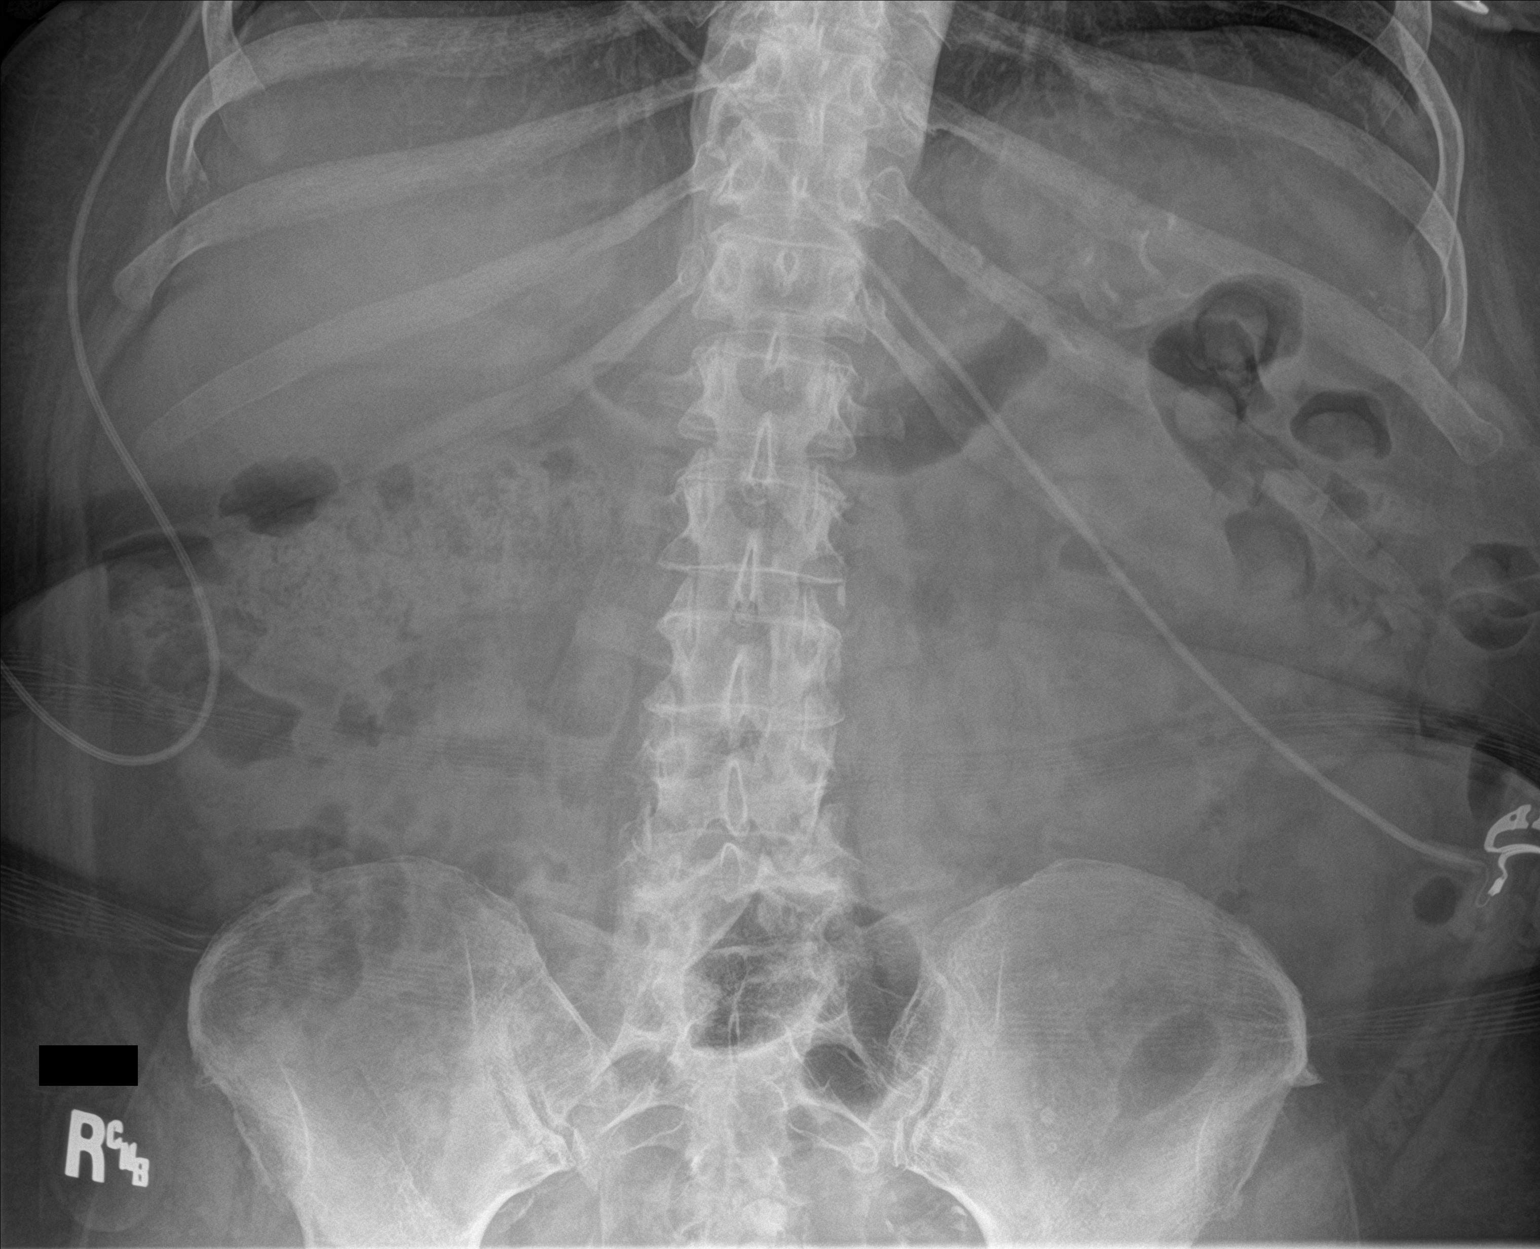

[2 of 2 positions shown; findings below may reference images not displayed]

FINDINGS: The bowel gas pattern is normal. Vascular calcifications are noted.
Degenerative changes are present in the lumbar spine. No
radio-opaque calculi or other significant radiographic abnormality
are seen.
IMPRESSION: Negative one view abdomen.

## 2023-07-23 DEATH — deceased
